# Patient Record
Sex: Female | Born: 2000 | Race: Black or African American | Hispanic: No | Marital: Single | Smoking: Never smoker
Health system: Southern US, Community
[De-identification: ages and names within clinical notes are randomized; demographics above are authoritative.]

---

## 2020-01-12 ENCOUNTER — Other Ambulatory Visit: Payer: Self-pay

## 2020-01-12 ENCOUNTER — Emergency Department (HOSPITAL_COMMUNITY)
Admission: EM | Admit: 2020-01-12 | Discharge: 2020-01-13 | Disposition: A | Discharge status: Home / Self Care | Payer: BC Managed Care – PPO | Attending: Emergency Medicine | Admitting: Emergency Medicine

## 2020-01-12 DIAGNOSIS — S0003XA Contusion of scalp, initial encounter: Secondary | ICD-10-CM | POA: Insufficient documentation

## 2020-01-12 DIAGNOSIS — Y939 Activity, unspecified: Secondary | ICD-10-CM | POA: Diagnosis not present

## 2020-01-12 DIAGNOSIS — Y929 Unspecified place or not applicable: Secondary | ICD-10-CM | POA: Insufficient documentation

## 2020-01-12 DIAGNOSIS — Y999 Unspecified external cause status: Secondary | ICD-10-CM | POA: Insufficient documentation

## 2020-01-12 DIAGNOSIS — S0990XA Unspecified injury of head, initial encounter: Secondary | ICD-10-CM | POA: Diagnosis present
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
## 2020-01-13 MED ORDER — ACETAMINOPHEN 325 MG PO TABS
650.0000 mg | ORAL_TABLET | Freq: Once | ORAL | Status: AC
  Administered 2020-01-13: 650 mg via ORAL
  Filled 2020-01-13: qty 2

## 2020-01-13 MED ORDER — ONDANSETRON 4 MG PO TBDP
8.0000 mg | ORAL_TABLET | Freq: Once | ORAL | Status: AC
  Administered 2020-01-13: 8 mg via ORAL
  Filled 2020-01-13: qty 2
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
## 2020-01-13 NOTE — ED Notes (Signed)
Pt is waiting outside

## 2020-05-26 DIAGNOSIS — R0602 Shortness of breath: Secondary | ICD-10-CM | POA: Insufficient documentation

## 2020-05-26 DIAGNOSIS — R079 Chest pain, unspecified: Secondary | ICD-10-CM | POA: Insufficient documentation

## 2020-05-26 DIAGNOSIS — Z5321 Procedure and treatment not carried out due to patient leaving prior to being seen by health care provider: Secondary | ICD-10-CM | POA: Diagnosis not present

## 2020-05-27 ENCOUNTER — Emergency Department (HOSPITAL_COMMUNITY)
Admission: EM | Admit: 2020-05-27 | Discharge: 2020-05-27 | Disposition: A | Discharge status: Home / Self Care | Payer: BC Managed Care – PPO | Attending: Emergency Medicine | Admitting: Emergency Medicine

## 2020-05-27 ENCOUNTER — Emergency Department (HOSPITAL_COMMUNITY): Payer: BC Managed Care – PPO

## 2020-05-27 ENCOUNTER — Other Ambulatory Visit: Payer: Self-pay

## 2020-05-27 LAB — CBC
HCT: 34.3 % — ABNORMAL LOW (ref 36.0–46.0)
Hemoglobin: 9.9 g/dL — ABNORMAL LOW (ref 12.0–15.0)
MCH: 24.1 pg — ABNORMAL LOW (ref 26.0–34.0)
MCHC: 28.9 g/dL — ABNORMAL LOW (ref 30.0–36.0)
MCV: 83.5 fL (ref 80.0–100.0)
Platelets: 383 10*3/uL (ref 150–400)
RBC: 4.11 MIL/uL (ref 3.87–5.11)
RDW: 15.3 % (ref 11.5–15.5)
WBC: 6.3 10*3/uL (ref 4.0–10.5)
nRBC: 0 % (ref 0.0–0.2)

## 2020-05-27 LAB — I-STAT BETA HCG BLOOD, ED (MC, WL, AP ONLY): I-stat hCG, quantitative: <5 m[IU]/mL (ref <5)

## 2020-05-27 LAB — BASIC METABOLIC PANEL
Anion gap: 9 (ref 5–15)
BUN: 16 mg/dL (ref 6–20)
CO2: 26 mmol/L (ref 22–32)
Calcium: 9.2 mg/dL (ref 8.9–10.3)
Chloride: 105 mmol/L (ref 98–111)
Creatinine, Ser: 0.73 mg/dL (ref 0.44–1.00)
GFR, Estimated: >60 mL/min (ref >60)
Glucose, Bld: 83 mg/dL (ref 70–99)
Potassium: 3.4 mmol/L — ABNORMAL LOW (ref 3.5–5.1)
Sodium: 140 mmol/L (ref 135–145)

## 2020-05-27 LAB — TROPONIN I (HIGH SENSITIVITY): Troponin I (High Sensitivity): <2 ng/L (ref <18)

## 2020-05-27 NOTE — ED Triage Notes (Signed)
Patient reports intermittent central chest pain with mild SOB this week , no cough or fever , denies emesis or diaphoresis .

## 2020-05-27 NOTE — ED Notes (Signed)
Called pt multiple times for vitals recheck and no answer. Pt left

## 2020-05-27 NOTE — ED Notes (Signed)
Called pt for vitals recheck no answer.

## 2021-04-13 ENCOUNTER — Emergency Department (HOSPITAL_COMMUNITY)
Admission: EM | Admit: 2021-04-13 | Discharge: 2021-04-13 | Disposition: A | Discharge status: Home / Self Care | Payer: BC Managed Care – PPO | Attending: Emergency Medicine | Admitting: Emergency Medicine

## 2021-04-13 ENCOUNTER — Other Ambulatory Visit: Payer: Self-pay

## 2021-04-13 DIAGNOSIS — Y92410 Unspecified street and highway as the place of occurrence of the external cause: Secondary | ICD-10-CM | POA: Insufficient documentation

## 2021-04-13 DIAGNOSIS — M549 Dorsalgia, unspecified: Secondary | ICD-10-CM | POA: Insufficient documentation

## 2021-04-13 DIAGNOSIS — M542 Cervicalgia: Secondary | ICD-10-CM | POA: Insufficient documentation

## 2021-04-13 DIAGNOSIS — Z5321 Procedure and treatment not carried out due to patient leaving prior to being seen by health care provider: Secondary | ICD-10-CM | POA: Insufficient documentation
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
## 2021-06-15 ENCOUNTER — Emergency Department (HOSPITAL_COMMUNITY)
Admission: EM | Admit: 2021-06-15 | Discharge: 2021-06-15 | Disposition: A | Discharge status: Home / Self Care | Payer: Self-pay | Attending: Emergency Medicine | Admitting: Emergency Medicine

## 2021-06-15 ENCOUNTER — Encounter (HOSPITAL_COMMUNITY): Payer: Self-pay

## 2021-06-15 ENCOUNTER — Emergency Department (HOSPITAL_COMMUNITY): Payer: Self-pay

## 2021-06-15 ENCOUNTER — Other Ambulatory Visit: Payer: Self-pay

## 2021-06-15 DIAGNOSIS — M79643 Pain in unspecified hand: Secondary | ICD-10-CM

## 2021-06-15 DIAGNOSIS — S60012A Contusion of left thumb without damage to nail, initial encounter: Secondary | ICD-10-CM | POA: Insufficient documentation

## 2021-06-15 MED ORDER — IBUPROFEN 200 MG PO TABS
600.0000 mg | ORAL_TABLET | Freq: Once | ORAL | Status: AC
  Administered 2021-06-15: 22:00:00 600 mg via ORAL
  Filled 2021-06-15: qty 3
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
## 2023-11-25 ENCOUNTER — Encounter (HOSPITAL_BASED_OUTPATIENT_CLINIC_OR_DEPARTMENT_OTHER): Payer: Self-pay | Admitting: Emergency Medicine

## 2023-11-25 ENCOUNTER — Other Ambulatory Visit: Payer: Self-pay

## 2023-11-25 ENCOUNTER — Emergency Department (HOSPITAL_BASED_OUTPATIENT_CLINIC_OR_DEPARTMENT_OTHER)
Admission: EM | Admit: 2023-11-25 | Discharge: 2023-11-25 | Disposition: A | Discharge status: Home / Self Care | Payer: Self-pay

## 2023-11-25 DIAGNOSIS — Z202 Contact with and (suspected) exposure to infections with a predominantly sexual mode of transmission: Secondary | ICD-10-CM | POA: Insufficient documentation

## 2023-11-25 DIAGNOSIS — B3731 Acute candidiasis of vulva and vagina: Secondary | ICD-10-CM | POA: Insufficient documentation

## 2023-11-25 DIAGNOSIS — R21 Rash and other nonspecific skin eruption: Secondary | ICD-10-CM | POA: Insufficient documentation

## 2023-11-25 LAB — URINALYSIS, MICROSCOPIC (REFLEX)

## 2023-11-25 LAB — PREGNANCY, URINE: Preg Test, Ur: NEGATIVE

## 2023-11-25 LAB — WET PREP, GENITAL
Clue Cells Wet Prep HPF POC: NONE SEEN
Sperm: NONE SEEN
Trich, Wet Prep: NONE SEEN
WBC, Wet Prep HPF POC: <10 (ref <10)

## 2023-11-25 LAB — RAPID HIV SCREEN (HIV 1/2 AB+AG)
HIV 1/2 Antibodies: NONREACTIVE
HIV-1 P24 Antigen - HIV24: NONREACTIVE

## 2023-11-25 LAB — URINALYSIS, ROUTINE W REFLEX MICROSCOPIC
Bilirubin Urine: NEGATIVE
Glucose, UA: NEGATIVE mg/dL
Ketones, ur: NEGATIVE mg/dL
Leukocytes,Ua: NEGATIVE
Nitrite: NEGATIVE
Protein, ur: NEGATIVE mg/dL
Specific Gravity, Urine: 1.025 (ref 1.005–1.030)
pH: 6.5 (ref 5.0–8.0)

## 2023-11-25 MED ORDER — CEFTRIAXONE SODIUM 500 MG IJ SOLR
500.0000 mg | Freq: Once | INTRAMUSCULAR | Status: AC
  Administered 2023-11-25: 500 mg via INTRAMUSCULAR
  Filled 2023-11-25: qty 500

## 2023-11-25 MED ORDER — DOXYCYCLINE HYCLATE 100 MG PO TABS
100.0000 mg | ORAL_TABLET | Freq: Once | ORAL | Status: AC
  Administered 2023-11-25: 100 mg via ORAL
  Filled 2023-11-25: qty 1

## 2023-11-25 MED ORDER — FLUCONAZOLE 150 MG PO TABS: 150.0000 mg | ORAL_TABLET | Freq: Every day | ORAL | 0 refills | Status: AC

## 2023-11-25 MED ORDER — DOXYCYCLINE HYCLATE 100 MG PO CAPS: 100.0000 mg | ORAL_CAPSULE | Freq: Two times a day (BID) | ORAL | 0 refills | Status: AC

## 2023-11-25 MED ORDER — LIDOCAINE HCL (PF) 1 % IJ SOLN
INTRAMUSCULAR | Status: AC
  Administered 2023-11-25: 1 mL
  Filled 2023-11-25: qty 5

## 2023-11-25 NOTE — ED Provider Notes (Signed)
 [place] [facility]ERGENCY DEPARTMENT AT [facility] Provider Note   CSN: [id number] Arrival date & time: 11/25/23  1324     History  Chief Complaint  Patient presents with   Abdominal Pain    Kari Hernandez is a 23 y.o. female.  23 year old female presenting with STI exposure, change in vaginal discharge/odor, pelvic pain.  Patient is sexually active with 1 partner and has been for the last several years, she found out that in March of this year her partner tested positive for gonorrhea/chlamydia but he has completed treatment for these infections.  She reports lower abdominal stabbing pain that has been occurring almost daily since May.  She reports noticing a change in the consistency and odor of her vaginal discharge but denies GU itching/lesions.  She is on day 2 of her menstrual cycle.  She denies dysuria/hematuria.  She reports having a fever several days ago but none since, this was resolved with Tylenol .  She did have several episodes of vomiting last night, however she had been drinking but reports she had only had 1 drink and says that this typically does not cause her to vomit.  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   Abdominal Pain      Home Medications Prior to Admission medications   Not on File      Allergies    Patient has no known allergies.    Review of Systems   Review of Systems  Gastrointestinal:  Positive for abdominal pain.    Physical Exam Updated Vital Signs BP 112/83 (BP Location: Left Arm)   Pulse 79   Temp 98.3 F (36.8 C) (Oral)   Resp 16   Wt 62.6 kg   LMP 11/23/2023 (Exact Date)   SpO2 100%  Physical Exam Vitals and nursing note reviewed.  HENT:     Head: Normocephalic.  Cardiovascular:     Rate and Rhythm: Normal rate.  Pulmonary:     Effort: Pulmonary effort is  normal.  Abdominal:     General: Bowel sounds are normal.     Palpations: Abdomen is soft.     Tenderness: There is no abdominal tenderness. There is no guarding.  Genitourinary:    General: Normal vulva.     Vagina: No signs of injury. No lesions.     Cervix: Normal. No cervical motion tenderness.     Comments: Pelvic exam performed with NT at the bedside as chaperone  Minimal bleeding from cervical os Musculoskeletal:     Comments: Moves all extremities spontaneously without difficulty  Skin:    General: Skin is warm and dry.     Comments: Scattered maculopapular rash to anterior chest  Neurological:     Mental Status: She is alert and oriented to person, place, and time.     ED Results / Procedures / Treatments   Labs (all labs ordered are listed, but only abnormal results are displayed) Labs Reviewed  WET PREP, GENITAL - Abnormal; Notable for the following components:      Result Value   Yeast Wet Prep HPF POC PRESENT (*)    All other components within normal limits  URINALYSIS, ROUTINE W REFLEX MICROSCOPIC - Abnormal; Notable for the following components:   Hgb urine dipstick LARGE (*)    All other components within normal limits  URINALYSIS, MICROSCOPIC (REFLEX) - Abnormal; Notable  for the following components:   Bacteria, UA RARE (*)    All other components within normal limits  PREGNANCY, URINE  RAPID HIV SCREEN (HIV 1/2 AB+AG)  RPR  GC/CHLAMYDIA PROBE AMP ([place]) NOT AT [facility]    EKG None  Radiology No results found.  Procedures Procedures    Medications Ordered in ED Medications  cefTRIAXone (ROCEPHIN) injection 500 mg (500 mg Intramuscular Given 11/25/23 1526)  doxycycline (VIBRA-TABS) tablet 100 mg (100 mg Oral Given 11/25/23 1527)  lidocaine (PF) (XYLOCAINE) 1 % injection (1 mL  Given 11/25/23 1527)    ED Course/ Medical Decision Making/ A&P                                 Medical Decision Making This patient presents to the ED for concern of  STI exposure, this involves an extensive number of treatment options, and is a complaint that carries with it a high risk of complications and morbidity.  The differential diagnosis includes gonorrhea/chlamydia/syphilis/HIV, bacterial vaginosis, vulvovaginal candidiasis, pelvic inflammatory disease.    Lab Tests:  I Ordered, and personally interpreted labs.  The pertinent results include: Urine pregnancy negative.  Urinalysis positive for large RBCs with rare bacteria on microscopy, patient is on her menstrual cycle so this is likely secondary to menstrual bleeding.  Wet prep notable for yeast, no trichomoniasis/clue cells/white blood cells/sperm.  HIV negative.  RPR and gonorrhea/chlamydia probe pending.   Problem List / ED Course / Critical interventions / Medication management   I ordered medication including Rocephin and Doxycycline  for presumed diagnosis of gonorrhea/chlamydia  I have reviewed the patients home medicines and have made adjustments as needed   Social Determinants of Health:  No PCP   Test / Admission - Considered:  Physical exam is largely unremarkable, abdomen is nontender to palpation, pelvic exam performed with nurse technician at the bedside as chaperone, there was normal blood coming from the cervical os however patient is on her menstrual cycle currently, no cervical motion tenderness or purulent discharge noted.  At this time I have a low suspicion for pelvic inflammatory disease, however given that patient was exposed to gonorrhea/chlamydia I will preemptively treat her for these as well as a yeast infection given that yeast was noted on the wet prep.  IM Rocephin and first dose of doxycycline was administered in the emergency department today.  RPR and gonorrhea/chlamydia probe pending at time of discharge.  Additionally noted on physical exam was the presence of a maculopapular rash to the patient's anterior chest, however no rashes to palms/soles, this rash is  likely contact dermatitis given patient's known allergy to certain jewelry materials.  Recommend patient apply hydrocortisone cream to the affected area.  I encouraged the patient to contact [facility] to establish care as she does not have a primary care provider.  I emphasized importance of medication compliance in treating presumed STI, advised patient to avoid intercourse while on these medications and for 2 weeks after completion of these, recommend that any sexual partners also be tested for STIs.  Return precautions discussed.  Patient is agreeable with this plan and is appropriate for discharge at this time.    Amount and/or Complexity of Data Reviewed Labs: ordered.  Risk Prescription drug management.           Final Clinical Impression(s) / ED Diagnoses Final diagnoses:  Possible exposure to STI  Vulvovaginal candidiasis  Rx / DC Orders ED Discharge Orders          Ordered    doxycycline (VIBRAMYCIN) 100 MG capsule  2 times daily        11/25/23 1605    fluconazole (DIFLUCAN) 150 MG tablet  Daily        11/25/23 1605              Kendrick Pax, [place] 11/25/23 1614    Sallyanne Creamer, DO 11/28/23 2235

## 2023-11-25 NOTE — Discharge Instructions (Addendum)
 Return to the emergency department if your symptoms worsen.  Start doxycycline, 1 tablet by mouth twice daily.  You received your first dose of this medication while in the emergency department today.  Start fluconazole, take 1 tablet by mouth today, if your symptoms persist after 3 days you may take an additional tablet.  I have provided you with the contact information for [facility], you may contact them to establish care since you do not have a primary care provider.
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
## 2023-11-26 LAB — GC/CHLAMYDIA PROBE AMP (~~LOC~~) NOT AT ARMC
Chlamydia: NEGATIVE
Comment: NEGATIVE
Comment: NORMAL
Neisseria Gonorrhea: NEGATIVE

## 2023-11-26 LAB — RPR: RPR Ser Ql: NONREACTIVE
